# Patient Record
Sex: Female | Born: 1970 | Race: White | Hispanic: No | Marital: Married | Smoking: Never smoker
Health system: Southern US, Community
[De-identification: ages and names within clinical notes are randomized; demographics above are authoritative.]

## PROBLEM LIST (undated history)

## (undated) DIAGNOSIS — I10 Essential (primary) hypertension: Secondary | ICD-10-CM

## (undated) HISTORY — PX: EXTERNAL EAR SURGERY: SHX627

## (undated) HISTORY — PX: EYE SURGERY: SHX253

---

## 2006-10-14 ENCOUNTER — Ambulatory Visit (HOSPITAL_COMMUNITY): Admission: AD | Admit: 2006-10-14 | Discharge: 2006-10-15 | Payer: Self-pay | Admitting: Obstetrics & Gynecology

## 2006-10-14 ENCOUNTER — Encounter (INDEPENDENT_AMBULATORY_CARE_PROVIDER_SITE_OTHER): Payer: Self-pay | Admitting: Obstetrics and Gynecology

## 2008-08-01 ENCOUNTER — Inpatient Hospital Stay (HOSPITAL_COMMUNITY): Admission: AD | Admit: 2008-08-01 | Discharge: 2008-08-03 | Payer: Self-pay | Admitting: Obstetrics and Gynecology

## 2010-05-30 LAB — CBC
HCT: 33.7 % — ABNORMAL LOW (ref 36.0–46.0)
HCT: 38.4 % (ref 36.0–46.0)
Hemoglobin: 12 g/dL (ref 12.0–15.0)
MCHC: 35.6 g/dL (ref 30.0–36.0)
MCV: 95 fL (ref 78.0–100.0)
MCV: 96.8 fL (ref 78.0–100.0)
RBC: 4.05 MIL/uL (ref 3.87–5.11)
RDW: 14.4 % (ref 11.5–15.5)
WBC: 9 10*3/uL (ref 4.0–10.5)

## 2010-06-23 ENCOUNTER — Other Ambulatory Visit: Payer: Self-pay | Admitting: Family Medicine

## 2010-06-23 ENCOUNTER — Other Ambulatory Visit (HOSPITAL_COMMUNITY)
Admission: RE | Admit: 2010-06-23 | Discharge: 2010-06-23 | Disposition: A | Discharge status: Home / Self Care | Payer: Managed Care, Other (non HMO) | Source: Ambulatory Visit | Attending: Family Medicine | Admitting: Family Medicine

## 2010-06-23 DIAGNOSIS — Z1159 Encounter for screening for other viral diseases: Secondary | ICD-10-CM | POA: Insufficient documentation

## 2010-06-23 DIAGNOSIS — Z124 Encounter for screening for malignant neoplasm of cervix: Secondary | ICD-10-CM | POA: Insufficient documentation

## 2010-07-05 NOTE — Op Note (Signed)
NAMEBRITTANIE, Gina Bryant               ACCOUNT NO.:  [id number]   MEDICAL RECORD NO.:  [id number]          PATIENT TYPE:  MAT   LOCATION:  [facility]                          FACILITY:  [facility]   PHYSICIAN:  Lenoard Aden, M.D.DATE OF BIRTH:  Sep 07, 1970   DATE OF PROCEDURE:  10/14/2006  DATE OF DISCHARGE:                               OPERATIVE REPORT   PREOPERATIVE DIAGNOSIS:  Incomplete AB.   POSTOPERATIVE DIAGNOSIS:  Incomplete AB.   PROCEDURE:  Suction D&E.   SURGEON:  Lenoard Aden, M.D.   ANESTHESIA:  MAC paracervical.   BLOOD LOSS:  Blood loss from procedure is minimal.  Total blood loss  from the time of arrival is noted in the operating room and in maternity  admissions of at least 200 mL.  The patient to recovery in good  condition.   DESCRIPTION OF PROCEDURE:  After being apprised of risks of anesthesia,  infection, bleeding, uterine perforation, possible need for repair,  delayed versus immediate complications to include bowel and bladder  injury with possible need for repair, the patient brought to the  operating. She was administered IV sedation without difficulty, prepped  and draped in sterile fashion.  Catheterized until the bladder is empty.  Examination under anesthesia revealed a large amount of vaginal bleeding  and opened cervix and products of conception noted to be at the external  os. Paracervical block placed using a 20 mL of a dilute Xylocaine  solution in the standard fashion.  10 mm suction curette is placed and  products of conception easily extracted from the uterus without  difficulty.  Repeat suction and then curettage in a blunt fashion with  four quadrant method is done without difficulty revealing the cavity to  be empty.  Good hemostasis noted.  All instruments were removed.  The  patient is awakened, transferred to recovery in good condition.      Lenoard Aden, M.D.  Electronically Signed     RJT/MEDQ  D:  10/14/2006  T:   10/14/2006  Job:  [id number]

## 2010-07-05 NOTE — Op Note (Signed)
[name], Gina Bryant               ACCOUNT NO.:  [id number]   MEDICAL RECORD NO.:  [id number]          PATIENT TYPE:  INP   LOCATION:  9115                          FACILITY:  [facility]   PHYSICIAN:  Lenoard Aden, M.D.DATE OF BIRTH:  1970/08/25   DATE OF PROCEDURE:  08/01/2008  DATE OF DISCHARGE:                               OPERATIVE REPORT   DESCRIPTION OF PROCEDURE:  The patient had a spontaneous vaginal  delivery after pushing well of a full-term living female over second-  degree laceration after delivery of the vertex, shoulder dystocia was  encountered.  McRoberts positioning and suprapubic pressure did not  relieve the shoulder dystocia.  Unable to perform Joseph Art or Rubin's screw  maneuver, therefore delivery of the posterior arm was attempted and is  successful.  Total time of the shoulder dystocia is less than 30  seconds.  Apgars on the fetus are 8 and 9, nuchal cord x1 as noted.  It  was reduced on the perineum prior to delivery of the fetal shoulders.  Second-degree laceration was repaired with a 3-0 Vicryl Rapide.  Estimated blood loss was 500 mL.  No complications.  Bulb suctioning  performed.  Placenta delivered spontaneously intact with three-vessel  cord.  No cervical lacerations noted.  Mother and baby recovering in  good condition.      Lenoard Aden, M.D.  Electronically Signed     RJT/MEDQ  D:  08/01/2008  T:  08/01/2008  Job:  [id number]

## 2010-07-05 NOTE — H&P (Signed)
NAMEKASSIE, KENG               ACCOUNT NO.:  [id number]   MEDICAL RECORD NO.:  [id number]          PATIENT TYPE:  INP   LOCATION:  9115                          FACILITY:  [facility]   PHYSICIAN:  Lenoard Aden, M.D.DATE OF BIRTH:  [birth year]   DATE OF ADMISSION:  08/01/2008  DATE OF DISCHARGE:                              HISTORY & PHYSICAL   CHIEF COMPLAINT:  Postdates induction.  She is a 40 year old white  female, G5, P3, history of SAB x1 and SVD x3 who now presents with 41  weeks for induction.  She is a nonsmoker, nondrinker.  She has no known  drug allergies.  She denies domestic or physical violence.  She has a  medical history unremarkable for SAB with emergency D and C and SVD x3.  Prenatal course has been complicated.   MEDICATIONS:  Prenatal vitamins.   PHYSICAL EXAMINATION:  GENERAL:  She is a well-developed, well-nourished  white female in no acute distress.  HEENT:  Normal.  LUNGS:  Clear.  HEART:  Regular rate and rhythm.  ABDOMEN:  Soft, gravid, nontender.  Estimated fetal weight 8 pounds.  Cervix is 3, 50% vertex, -2.  EXTREMITIES:  No cords.  NEUROLOGIC:  Nonfocal.  SKIN:  Intact.  NSG is reactive.   IMPRESSION:  A 41-week intrauterine pregnancy for induction.   PLAN:  Proceed with induction.  Risks, benefits discussed.  IV Pitocin  was started, high-dose protocol, epidural p.r.n., anticipate attempts at  vaginal delivery.      Lenoard Aden, M.D.  Electronically Signed     RJT/MEDQ  D:  08/01/2008  T:  08/01/2008  Job:  [id number]

## 2010-12-02 LAB — CBC
HCT: 22.7 — ABNORMAL LOW
MCHC: 35.6
Platelets: 223
RDW: 12.8
RDW: 13.2

## 2010-12-02 LAB — SAMPLE TO BLOOD BANK

## 2012-10-23 ENCOUNTER — Other Ambulatory Visit: Payer: Self-pay | Admitting: Family Medicine

## 2012-10-23 DIAGNOSIS — R221 Localized swelling, mass and lump, neck: Secondary | ICD-10-CM

## 2012-10-29 ENCOUNTER — Ambulatory Visit
Admission: RE | Admit: 2012-10-29 | Discharge: 2012-10-29 | Disposition: A | Discharge status: Home / Self Care | Payer: Managed Care, Other (non HMO) | Source: Ambulatory Visit | Attending: Family Medicine | Admitting: Family Medicine

## 2012-10-29 DIAGNOSIS — R221 Localized swelling, mass and lump, neck: Secondary | ICD-10-CM

## 2012-10-29 MED ORDER — IOHEXOL 300 MG/ML  SOLN
75.0000 mL | Freq: Once | INTRAMUSCULAR | Status: AC | PRN
  Administered 2012-10-29: 75 mL via INTRAVENOUS

## 2014-12-11 ENCOUNTER — Other Ambulatory Visit: Payer: Self-pay | Admitting: Family Medicine

## 2014-12-11 DIAGNOSIS — R140 Abdominal distension (gaseous): Secondary | ICD-10-CM

## 2014-12-22 ENCOUNTER — Ambulatory Visit
Admission: RE | Admit: 2014-12-22 | Discharge: 2014-12-22 | Disposition: A | Discharge status: Home / Self Care | Payer: Managed Care, Other (non HMO) | Source: Ambulatory Visit | Attending: Family Medicine | Admitting: Family Medicine

## 2014-12-22 DIAGNOSIS — R140 Abdominal distension (gaseous): Secondary | ICD-10-CM

## 2014-12-22 MED ORDER — IOPAMIDOL (ISOVUE-300) INJECTION 61%: 100.0000 mL | Freq: Once | INTRAVENOUS | Status: DC | PRN

## 2015-01-07 ENCOUNTER — Encounter (HOSPITAL_COMMUNITY): Payer: Self-pay | Admitting: Neurology

## 2015-01-07 ENCOUNTER — Emergency Department (HOSPITAL_COMMUNITY)
Admission: EM | Admit: 2015-01-07 | Discharge: 2015-01-08 | Disposition: A | Discharge status: Home / Self Care | Payer: Managed Care, Other (non HMO) | Attending: Emergency Medicine | Admitting: Emergency Medicine

## 2015-01-07 ENCOUNTER — Emergency Department (HOSPITAL_COMMUNITY): Payer: Managed Care, Other (non HMO)

## 2015-01-07 DIAGNOSIS — Z79899 Other long term (current) drug therapy: Secondary | ICD-10-CM | POA: Diagnosis not present

## 2015-01-07 DIAGNOSIS — Z3202 Encounter for pregnancy test, result negative: Secondary | ICD-10-CM | POA: Insufficient documentation

## 2015-01-07 DIAGNOSIS — R0789 Other chest pain: Secondary | ICD-10-CM | POA: Insufficient documentation

## 2015-01-07 DIAGNOSIS — R000 Tachycardia, unspecified: Secondary | ICD-10-CM | POA: Insufficient documentation

## 2015-01-07 DIAGNOSIS — R079 Chest pain, unspecified: Secondary | ICD-10-CM | POA: Diagnosis present

## 2015-01-07 DIAGNOSIS — R0602 Shortness of breath: Secondary | ICD-10-CM | POA: Insufficient documentation

## 2015-01-07 LAB — BASIC METABOLIC PANEL
Anion gap: 8 (ref 5–15)
BUN: 17 mg/dL (ref 6–20)
CALCIUM: 9.7 mg/dL (ref 8.9–10.3)
CO2: 25 mmol/L (ref 22–32)
Chloride: 105 mmol/L (ref 101–111)
Creatinine, Ser: 0.65 mg/dL (ref 0.44–1.00)
GFR calc Af Amer: >60 mL/min (ref >60)
GLUCOSE: 99 mg/dL (ref 65–99)
Potassium: 3.9 mmol/L (ref 3.5–5.1)
Sodium: 138 mmol/L (ref 135–145)

## 2015-01-07 LAB — CBC
HEMATOCRIT: 41.3 % (ref 36.0–46.0)
Hemoglobin: 14.2 g/dL (ref 12.0–15.0)
MCH: 30.8 pg (ref 26.0–34.0)
MCHC: 34.4 g/dL (ref 30.0–36.0)
MCV: 89.6 fL (ref 78.0–100.0)
Platelets: 259 10*3/uL (ref 150–400)
RBC: 4.61 MIL/uL (ref 3.87–5.11)
RDW: 12.8 % (ref 11.5–15.5)
WBC: 7.9 10*3/uL (ref 4.0–10.5)

## 2015-01-07 LAB — URINALYSIS, ROUTINE W REFLEX MICROSCOPIC
BILIRUBIN URINE: NEGATIVE
Glucose, UA: NEGATIVE mg/dL
Ketones, ur: NEGATIVE mg/dL
Leukocytes, UA: NEGATIVE
NITRITE: NEGATIVE
PH: 6 (ref 5.0–8.0)
Protein, ur: NEGATIVE mg/dL
SPECIFIC GRAVITY, URINE: 1.024 (ref 1.005–1.030)

## 2015-01-07 LAB — URINE MICROSCOPIC-ADD ON

## 2015-01-07 LAB — I-STAT BETA HCG BLOOD, ED (MC, WL, AP ONLY): I-stat hCG, quantitative: <5 m[IU]/mL (ref <5)

## 2015-01-07 LAB — I-STAT TROPONIN, ED: TROPONIN I, POC: 0 ng/mL (ref 0.00–0.08)

## 2015-01-07 LAB — PREGNANCY, URINE: Preg Test, Ur: NEGATIVE

## 2015-01-07 MED ORDER — IOHEXOL 350 MG/ML SOLN
100.0000 mL | Freq: Once | INTRAVENOUS | Status: AC | PRN
  Administered 2015-01-07: 100 mL via INTRAVENOUS

## 2015-01-07 MED ORDER — MORPHINE SULFATE (PF) 4 MG/ML IV SOLN
4.0000 mg | Freq: Once | INTRAVENOUS | Status: AC
  Administered 2015-01-07: 4 mg via INTRAVENOUS
  Filled 2015-01-07: qty 1

## 2015-01-07 NOTE — ED Provider Notes (Signed)
CSN: [id number]     Arrival date & time 01/07/15  1706 History   First MD Initiated Contact with Patient 01/07/15 2004     Chief Complaint  Patient presents with  . Chest Pain     (Consider location/radiation/quality/duration/timing/severity/associated sxs/prior Treatment) The history is provided by the patient and medical records. No language interpreter was used.     Gina Bryant is a 44 y.o. female  with no major medical problems presents to the Emergency Department complaining of gradual, persistent, progressively worsening central chest pain radiating to the left arm onset yesterday evening while watching TV.  Pt describes her pain as dull and intermittently sharp and rates it at a 8/10.  Pt denies personal cardiac hx or family cardiac hx. no history of sun death before the age of 44. Patient denies recent travel, swelling of her legs, history of DVT, estrogen usage, recent surgeries or recent fractures.  She reports that her chest pain immediately worsens with movement and palpation. It is also worse with inspiration. She reports this afternoon that her left arm felt heavy but she has had no radiation of the pain from the left side of her chest to her jaw or arm. She reports that during the time that her left arm felt heavy she also felt somewhat clammy but did not become diaphoretic and had no nausea vomiting or diarrhea. She denies sick contacts, cough, congestion, fever, chills, headache, neck pain, abdominal pain, weakness, dizziness, syncope. Nothing seems to make her pain better.   History reviewed. No pertinent past medical history. Past Surgical History  Procedure Laterality Date  . Eye surgery    . External ear surgery     No family history on file. Social History  Substance Use Topics  . Smoking status: Never Smoker   . Smokeless tobacco: None  . Alcohol Use: Yes   OB History    No data available     Review of Systems  Constitutional: Negative for fever,  diaphoresis, appetite change, fatigue and unexpected weight change.  HENT: Negative for mouth sores.   Eyes: Negative for visual disturbance.  Respiratory: Positive for shortness of breath. Negative for cough, chest tightness and wheezing.   Cardiovascular: Positive for chest pain.  Gastrointestinal: Negative for nausea, vomiting, abdominal pain, diarrhea and constipation.  Endocrine: Negative for polydipsia, polyphagia and polyuria.  Genitourinary: Negative for dysuria, urgency, frequency and hematuria.  Musculoskeletal: Negative for back pain and neck stiffness.  Skin: Negative for rash.  Allergic/Immunologic: Negative for immunocompromised state.  Neurological: Negative for syncope, light-headedness and headaches.  Hematological: Does not bruise/bleed easily.  Psychiatric/Behavioral: Negative for sleep disturbance. The patient is not nervous/anxious.       Allergies  Review of patient's allergies indicates no known allergies.  Home Medications   Prior to Admission medications   Medication Sig Start Date End Date Taking? Authorizing Provider  calcium carbonate (TUMS - DOSED IN MG ELEMENTAL CALCIUM) 500 MG chewable tablet Chew 1 tablet by mouth daily as needed for indigestion or heartburn.   Yes Historical Provider, MD  naphazoline-pheniramine (NAPHCON-A) 0.025-0.3 % ophthalmic solution Place 1 drop into both eyes 4 (four) times daily as needed for irritation.   Yes Historical Provider, MD  diazepam (VALIUM) 5 MG tablet Take 1 tablet (5 mg total) by mouth every 12 (twelve) hours as needed for muscle spasms. 01/08/15   Annel Zunker, PA-C  naproxen (NAPROSYN) 500 MG tablet Take 1 tablet (500 mg total) by mouth 3 (three) times daily as  needed (pain and muscle spasm). 01/08/15   Raeann Offner, PA-C   BP 127/88 mmHg  Pulse 70  Temp(Src) 98.3 F (36.8 C) (Oral)  Resp 15  SpO2 98%  LMP 01/06/2015 Physical Exam  Constitutional: She appears well-developed and  well-nourished. No distress.  Awake, alert, nontoxic appearance  HENT:  Head: Normocephalic and atraumatic.  Mouth/Throat: Oropharynx is clear and moist. No oropharyngeal exudate.  Eyes: Conjunctivae are normal. No scleral icterus.  Neck: Normal range of motion. Neck supple.  Cardiovascular: Regular rhythm, normal heart sounds and intact distal pulses.  Tachycardia present.   No murmur heard. Pulses:      Radial pulses are 2+ on the right side, and 2+ on the left side.       Dorsalis pedis pulses are 2+ on the right side, and 2+ on the left side.  Capillary refill < 3 sec  Pulmonary/Chest: Effort normal and breath sounds normal. No respiratory distress. She has no wheezes.  Equal chest expansion  Abdominal: Soft. Bowel sounds are normal. She exhibits no mass. There is no tenderness. There is no rebound and no guarding.  Musculoskeletal: Normal range of motion. She exhibits no edema.  Neurological: She is alert.  Speech is clear and goal oriented Moves extremities without ataxia  Skin: Skin is warm and dry. She is not diaphoretic.  No rash  Psychiatric: She has a normal mood and affect.  Nursing note and vitals reviewed.   ED Course  Procedures (including critical care time) Labs Review Labs Reviewed  URINALYSIS, ROUTINE W REFLEX MICROSCOPIC (NOT AT [facility]) - Abnormal; Notable for the following:    Hgb urine dipstick MODERATE (*)    All other components within normal limits  URINE MICROSCOPIC-ADD ON - Abnormal; Notable for the following:    Squamous Epithelial / LPF 0-5 (*)    Bacteria, UA RARE (*)    All other components within normal limits  BASIC METABOLIC PANEL  CBC  PREGNANCY, URINE  I-STAT TROPOININ, ED  I-STAT BETA HCG BLOOD, ED ([facility], [facility], [facility] ONLY)    Imaging Review Dg Chest 2 View  01/07/2015  CLINICAL DATA:  Acute onset of generalized chest pain and left arm pain. Initial encounter. EXAM: CHEST  2 VIEW COMPARISON:  Chest radiograph performed 05/19/2009 FINDINGS: The  lungs are well-aerated and clear. There is no evidence of focal opacification, pleural effusion or pneumothorax. The heart is normal in size; the mediastinal contour is within normal limits. No acute osseous abnormalities are seen. IMPRESSION: No acute cardiopulmonary process seen. Electronically Signed   By: Roanna Raider M.D.   On: 01/07/2015 18:10   I have personally reviewed and evaluated these images and lab results as part of my medical decision-making.   EKG Interpretation   Date/Time:  Thursday January 07 2015 17:16:23 EST Ventricular Rate:  92 PR Interval:  158 QRS Duration: 80 QT Interval:  354 QTC Calculation: 437 R Axis:   62 Text Interpretation:  Normal sinus rhythm Normal ECG Confirmed by ALLEN   MD, ANTHONY ([id number]) on 01/07/2015 11:43:20 PM        MDM   Final diagnoses:  Left sided chest pain    IVYANNA SIBERT presents with 24 hours of chest pain. Patient here reporting worsening pain with movement and inspiration, no pain to palpation. Patient also a tachycardia. No history of blood clots or risk factors however concern for possible PE. Negative troponin and other labs reassuring. Chest x-ray without pneumonia or pneumothorax. Will obtain CT scan of  the chest.  18:45PM CT scan without evidence of PE or aortic dissection. There is a small left pleural effusion. Patient's pain continues to be present with movement.  No hypoxia here in the emergency department. No rash to suggest evidence of shingles. Will discharge home with anti-inflammatories and muscle relaxers. She is to follow with her primary care physician.  Chest pain is not likely of cardiac or pulmonary etiology d/t presentation, negative CT, VSS, no tracheal deviation, no JVD or new murmur, RRR, breath sounds equal bilaterally, EKG without acute abnormalities, negative troponin, and negative CXR. Pt has been advised to return to the ED if CP becomes exertional, associated with diaphoresis or nausea, radiates  to left jaw/arm, worsens or becomes concerning in any way.   Case has been discussed with and seen by Dr. Freida Busman who agrees with the above plan to discharge.   BP 127/88 mmHg  Pulse 70  Temp(Src) 98.3 F (36.8 C) (Oral)  Resp 15  SpO2 98%  LMP 01/06/2015    Dierdre Forth, PA-C 01/08/15 1610

## 2015-01-07 NOTE — ED Notes (Signed)
Pt reports yesterday was sitting watching TV developed central cp radiating into left arm, continued through night this morning while at her son's school, pain got worse and left arm felt heavy. Denies cardiac. Denies n/v/d, felt clammy. Pt is a x 4

## 2015-01-08 MED ORDER — OXYCODONE-ACETAMINOPHEN 5-325 MG PO TABS
2.0000 | ORAL_TABLET | Freq: Once | ORAL | Status: AC
  Administered 2015-01-08: 2 via ORAL
  Filled 2015-01-08: qty 2

## 2015-01-08 MED ORDER — DIAZEPAM 5 MG PO TABS
5.0000 mg | ORAL_TABLET | Freq: Two times a day (BID) | ORAL | Status: DC | PRN
Start: 2015-01-08 — End: 2017-10-02

## 2015-01-08 MED ORDER — NAPROXEN 500 MG PO TABS: 500.0000 mg | ORAL_TABLET | Freq: Three times a day (TID) | ORAL | Status: DC | PRN

## 2015-01-08 NOTE — ED Provider Notes (Signed)
Medical screening examination/treatment/procedure(s) were conducted as a shared visit with non-physician practitioner(s) and myself.  I personally evaluated the patient during the encounter.   EKG Interpretation   Date/Time:  Thursday January 07 2015 17:16:23 EST Ventricular Rate:  92 PR Interval:  158 QRS Duration: 80 QT Interval:  354 QTC Calculation: 437 R Axis:   62 Text Interpretation:  Normal sinus rhythm Normal ECG Confirmed by [name]   MD, Roxie Gueye ([id number]) on 01/07/2015 11:43:20 PM     Patient here with persistent left-sided chest pain is worse with movement as well as he can deep breath. Chest CT negative for PE. Suspect musculoskeletal etiology and will prescribe medications for this.  [name], MD 01/08/15 0001

## 2015-01-08 NOTE — Discharge Instructions (Signed)
1. Medications: valium, naprosyn, usual home medications 2. Treatment: rest, drink plenty of fluids,  3. Follow Up: Please followup with your primary doctor in 2 days for discussion of your diagnoses and further evaluation after today's visit; if you do not have a primary care doctor use the resource guide provided to find one; Please return to the ER for worsening pain, pain that radiates to the jaw or other concerns    Chest Wall Pain Chest wall pain is pain in or around the bones and muscles of your chest. Sometimes, an injury causes this pain. Sometimes, the cause may not be known. This pain may take several weeks or longer to get better. HOME CARE INSTRUCTIONS  Pay attention to any changes in your symptoms. Take these actions to help with your pain:   Rest as told by your health care provider.   Avoid activities that cause pain. These include any activities that use your chest muscles or your abdominal and side muscles to lift heavy items.   If directed, apply ice to the painful area:  Put ice in a plastic bag.  Place a towel between your skin and the bag.  Leave the ice on for 20 minutes, 2-3 times per day.  Take over-the-counter and prescription medicines only as told by your health care provider.  Do not use tobacco products, including cigarettes, chewing tobacco, and e-cigarettes. If you need help quitting, ask your health care provider.  Keep all follow-up visits as told by your health care provider. This is important. SEEK MEDICAL CARE IF:  You have a fever.  Your chest pain becomes worse.  You have new symptoms. SEEK IMMEDIATE MEDICAL CARE IF:  You have nausea or vomiting.  You feel sweaty or light-headed.  You have a cough with phlegm (sputum) or you cough up blood.  You develop shortness of breath.   This information is not intended to replace advice given to you by your health care provider. Make sure you discuss any questions you have with your health  care provider.   Document Released: 02/06/2005 Document Revised: 10/28/2014 Document Reviewed: 05/04/2014 Elsevier Interactive Patient Education 2016 [employer]    Emergency Department Resource Guide 1) Find a Doctor and Pay Out of Pocket Although you won't have to find out who is covered by your insurance plan, it is a good idea to ask around and get recommendations. You will then need to call the office and see if the doctor you have chosen will accept you as a new patient and what types of options they offer for patients who are self-pay. Some doctors offer discounts or will set up payment plans for their patients who do not have insurance, but you will need to ask so you aren't surprised when you get to your appointment.  2) Contact Your Local Health Department Not all health departments have doctors that can see patients for sick visits, but many do, so it is worth a call to see if yours does. If you don't know where your local health department is, you can check in your phone book. The CDC also has a tool to help you locate your state's health department, and many state websites also have listings of all of their local health departments.  3) Find a Walk-in Clinic If your illness is not likely to be very severe or complicated, you may want to try a walk in clinic. These are popping up all over the country in pharmacies, drugstores, and shopping centers. They're  usually staffed by nurse practitioners or physician assistants that have been trained to treat common illnesses and complaints. They're usually fairly quick and inexpensive. However, if you have serious medical issues or chronic medical problems, these are probably not your best option. ° °No Primary Care Doctor: °- Call Health Connect at  [phone] - they can help you locate a primary care doctor that  accepts your insurance, provides certain services, etc. °- Physician Referral Service- 1-800-533-3463 ° °Chronic Pain  Problems: °Organization         Address  Phone   Notes  °[facility]  ([phone]) 297-2271 Patients need to be referred by their primary care doctor.  ° °Medication Assistance: °Organization         Address  Phone   Notes  °[place] [facility] [address] °[place], [place] [address] (336) 641-8030 --Must be a resident of [place] °-- Must have NO insurance coverage whatsoever (no Medicaid/ Medicare, etc.) °-- The pt. MUST have a primary care doctor that directs their care regularly and follows them in the community °  °[facility]  (866) 331-1348   °[employer]  (888) 892-1162   ° °Agencies that provide inexpensive medical care: °Organization         Address  Phone   Notes  °[facility]  (336) 832-8035   °[facility]    (336) 832-7272   °[facility] [address] °[place], [place] [address] (336) 832-4777   °[facility] [address], °[place] (336) 271-4999   °[facility]    (336) 373-0678   °[facility]    (336) 272-1050   °[facility] ° [address], [place] Phone:  (336) 832-4444, Fax:  (336) 832-4440 Hours of Operation:  9 am - 6 pm, M-F.  Also accepts Medicaid/Medicare and self-pay.  °[facility] ° [address], [place] Phone: (336) [phone], Fax: (336) [phone]. Hours of Operation:  8:30 am - 5:30 pm, M-F.  Also accepts Medicaid and self-pay.  °[facility] [place] [address], [place] Phone: (336) 878-6027   °[facility] [address], [place], [place] (336)723-1848, Ext. 123 Mondays & Thursdays: 7-9 AM.  First 15 patients are seen on a first come, first serve basis. °  ° °Medicaid-accepting [place] Providers: ° °Organization         Address  Phone   Notes  °[facility] [address], [place] (336) [phone] Also  accepts self-pay patients.  °[facility] [address], [place] ° (336) 856-9996   °[facility] [address], [place] (336) 288-8857   °[facility] [address] [place] [address], [place] (336) 299-7000   °Veita Bland [address], [place]  ° (336) 373-1557 Only accepts [place] Access Medicaid patients after they have their name applied to their card.  ° °Self-Pay (no insurance) in [place]: ° °Organization         Address  Phone   Notes  °Sickle Cell Patients, [facility] [address], [place] (336) 832-1970   °[facility] [address],  (336) 832-4400   °[facility] [place] ° [address], [place] (336) 992-4800   °[facility]/Dr. Osei-Bonsu ° [address] [place] [address], [place] or [address], [place] (336) [phone] Phone number for both [place] and [place] locations is the same.  °[facility] [address], [place] (336) [phone]   °[facility] [address] [place] [address], [place] or [address] (336) 852-7530 °(336) 878-2260   °[facility] [address], [place] (336) 350-1642, phone; (336) 294-5005, fax Sees patients 1st and 3rd Saturday of every month.  Must not qualify for public or private insurance (i.e. Medicaid, Medicare, [place] Health Choice, Veterans' Benefits) • Household income should be no more than 200% of the poverty level •The clinic cannot treat you if you are pregnant or think you are pregnant • Sexually transmitted diseases are not treated at the clinic.  ° ° °Dental Care: °Organization         Address  Phone  Notes  °[place] [facility] [address], [place] (336) 641-6152 Accepts children up to age 21 who are enrolled in Medicaid or [place] Health Choice; pregnant  women with a Medicaid card; and children who have applied for Medicaid or [place] Health Choice, but were declined, whose parents can pay a reduced fee at time of service.  °[place] [facility] [place]  [address], [place] (336) 641-7733 Accepts children up to age 21 who are enrolled in Medicaid or [place] Health Choice; pregnant women with a Medicaid card; and children who have applied for Medicaid or [place] Health Choice, but were declined, whose parents can pay a reduced fee at time of service.  °[facility] ° [address], [place] (336) 641-4533 Patients are seen by appointment only. Walk-ins are not accepted. [facility] will see patients 18 years of age and older. °Monday - Tuesday (8am-5pm) °Most Wednesdays (8:30-5pm) °$30 per visit, cash only  °[facility] ° [address], [place] (336) [phone] Patients are seen by appointment only. Walk-ins are not accepted. [facility] will see patients 18 years of age and older. °One Wednesday Evening (Monthly: Volunteer Based).  $30 per visit, cash only  °[facility]  (919) [phone] for adults; Children under age 4, call Graduate Pediatric Dentistry at ([phone]) [phone]. Children aged 4-14, please call ([phone]) [phone] to request a pediatric application. ° Dental services are provided in all areas of dental care including fillings, crowns and bridges, complete and partial dentures, implants, gum treatment, root canals, and extractions. Preventive care is also provided. Treatment is provided to both adults and children. °Patients are selected via a lottery and there is often a waiting list. °  °[facility] [address], °[place] ° (336) 763-8833 [email or web address] °  °[facility] [address], [place],  (336)723-1848, Ext. 123 Second and Fourth Thursday of each month, opens at 6:30 AM; Clinic ends at 9 AM.  Patients are  seen on a first-come first-served basis, and a limited number are seen during each clinic.  ° °[facility] ° [address], [place],  (336) [phone]   Eligibility Requirements °You must have lived in [place], [place], or [place] counties for at least the last three months. °  You cannot be eligible for state or federal sponsored healthcare insurance, including Veterans Administration, Medicaid, or Medicare. °  You generally cannot be eligible for healthcare insurance through   your employer.    How to apply: Eligibility screenings are held every Tuesday and Wednesday afternoon from 1:00 pm until 4:00 pm. You do not need an appointment for the interview!  [facility] [address] [place], [place]   [place]  [place] Department  [place]  (727)[phone]    Behavioral Health Resources in the Community: Intensive Outpatient Programs Organization         Address  Phone  Notes  [place] [place]. [address] [place], [place] [phone]   [facility] [address] [place], [place]   [facility] [address], [place], [place]   [place] [address],  [place], [place] or (204)117-4192   Substance Abuse Resources Organization         Address  Phone  Notes  [facility]  862-182-1385   [place]  (563) 137-7909   [facility] [place]   Chinita Pester  (845) 431-1584   Residential & Outpatient Substance Abuse Program  959-281-1022   Psychological Services Organization         Address  Phone  Notes  [facility] [place]  [place]  [phone]   [place] [address], [place] or [phone]    Mobile Crisis  Teams Organization         Address  Phone  Notes  [facility], Mobile Crisis Care Unit  [phone]   [facility]  [address] [place], [place]   [place] [address], [place] [phone]    Self-Help/Support Groups Organization         Address  Phone             Notes  [place]. of [place] - variety of support groups  [place] Call for more information  Narcotics Anonymous (NA), [facility] [address], [employer] [place]  2 meetings at this location   Special educational needs teacher         Address  Phone  Notes  [facility] [place],    [place]  1-4017591152   [facility]  [address], [place] [phone], [place], [place]   [place], [place] [phone] Admissions: 8am-3pm M-F  Incentives Substance [place] [address],    [place], [place] [phone]   [facility] [address] [place], [place], [place]   [facility] [address],  [place], [place]   [facility] - Intensive Outpatient [address], [place], [place]   [facility] ([place] [address] [place]  [place], [place] [phone] or [phone]   [facility] ([facility]) [address] [place], [place] Accepts Medicaid  [facility] [place] [address],  [place] 1-219-097-3765 Substance Abuse/Addiction Treatment   [facility] Organization         Address  Phone  Notes  [facility]  [phone]   Domenic Schwab, PhD [address] [place], [place]   [phone] or [phone]   [place], [place] [phone]  [facility] [address], [place], [place] (336) [phone]  Insurance/Medicaid/sponsorship through [facility]  °[facility] [address]                                    [place], [place] (336) [phone] Therapy/tele-psych/case  °[facility] [address]  ° [place], [place] (336) [phone]    °Dr. Arfeen  (336) [phone]   °[facility] [place]  [employer] [place] [facility] 1) [address], [place] °2) [address], [place] °3)  [address], [place] (336) 349-3220 °(336) [phone] ° °(336) [phone]   °[place] Child Abuse Hotline (336) 342-1394 or (336) 342-3537 (After Hours)    ° ° ° ° °

## 2015-12-02 ENCOUNTER — Other Ambulatory Visit (HOSPITAL_COMMUNITY)
Admission: RE | Admit: 2015-12-02 | Discharge: 2015-12-02 | Disposition: A | Discharge status: Home / Self Care | Payer: Managed Care, Other (non HMO) | Source: Ambulatory Visit | Attending: Family Medicine | Admitting: Family Medicine

## 2015-12-02 ENCOUNTER — Other Ambulatory Visit: Payer: Self-pay | Admitting: Family Medicine

## 2015-12-02 DIAGNOSIS — Z124 Encounter for screening for malignant neoplasm of cervix: Secondary | ICD-10-CM | POA: Insufficient documentation

## 2015-12-02 DIAGNOSIS — Z1151 Encounter for screening for human papillomavirus (HPV): Secondary | ICD-10-CM | POA: Insufficient documentation

## 2015-12-03 LAB — CYTOLOGY - PAP

## 2017-05-24 ENCOUNTER — Other Ambulatory Visit: Payer: Self-pay | Admitting: Family Medicine

## 2017-05-24 DIAGNOSIS — Z139 Encounter for screening, unspecified: Secondary | ICD-10-CM

## 2017-06-15 ENCOUNTER — Ambulatory Visit: Payer: Managed Care, Other (non HMO)

## 2017-06-25 ENCOUNTER — Ambulatory Visit
Admission: RE | Admit: 2017-06-25 | Discharge: 2017-06-25 | Disposition: A | Discharge status: Home / Self Care | Payer: Commercial Managed Care - PPO | Source: Ambulatory Visit | Attending: Family Medicine | Admitting: Family Medicine

## 2017-06-25 DIAGNOSIS — Z139 Encounter for screening, unspecified: Secondary | ICD-10-CM

## 2017-09-26 ENCOUNTER — Other Ambulatory Visit: Payer: Self-pay | Admitting: Family Medicine

## 2017-09-26 DIAGNOSIS — R0989 Other specified symptoms and signs involving the circulatory and respiratory systems: Secondary | ICD-10-CM

## 2017-10-02 ENCOUNTER — Encounter (HOSPITAL_COMMUNITY): Payer: Self-pay

## 2017-10-02 ENCOUNTER — Emergency Department (HOSPITAL_COMMUNITY): Payer: Commercial Managed Care - PPO

## 2017-10-02 ENCOUNTER — Emergency Department (HOSPITAL_COMMUNITY)
Admission: EM | Admit: 2017-10-02 | Discharge: 2017-10-02 | Disposition: A | Discharge status: Home / Self Care | Payer: Commercial Managed Care - PPO | Attending: Emergency Medicine | Admitting: Emergency Medicine

## 2017-10-02 DIAGNOSIS — Z79899 Other long term (current) drug therapy: Secondary | ICD-10-CM | POA: Diagnosis not present

## 2017-10-02 DIAGNOSIS — R55 Syncope and collapse: Secondary | ICD-10-CM

## 2017-10-02 DIAGNOSIS — I10 Essential (primary) hypertension: Secondary | ICD-10-CM | POA: Diagnosis not present

## 2017-10-02 HISTORY — DX: Essential (primary) hypertension: I10

## 2017-10-02 LAB — CBC
HEMATOCRIT: 41 % (ref 36.0–46.0)
Hemoglobin: 14 g/dL (ref 12.0–15.0)
MCH: 31.1 pg (ref 26.0–34.0)
MCHC: 34.1 g/dL (ref 30.0–36.0)
MCV: 91.1 fL (ref 78.0–100.0)
PLATELETS: 361 10*3/uL (ref 150–400)
RBC: 4.5 MIL/uL (ref 3.87–5.11)
RDW: 12.7 % (ref 11.5–15.5)
WBC: 7.1 10*3/uL (ref 4.0–10.5)

## 2017-10-02 LAB — BASIC METABOLIC PANEL
Anion gap: 10 (ref 5–15)
BUN: 13 mg/dL (ref 6–20)
CHLORIDE: 103 mmol/L (ref 98–111)
CO2: 25 mmol/L (ref 22–32)
CREATININE: 0.62 mg/dL (ref 0.44–1.00)
Calcium: 9.4 mg/dL (ref 8.9–10.3)
GFR calc Af Amer: >60 mL/min (ref >60)
GFR calc non Af Amer: >60 mL/min (ref >60)
GLUCOSE: 125 mg/dL — AB (ref 70–99)
POTASSIUM: 4.1 mmol/L (ref 3.5–5.1)
Sodium: 138 mmol/L (ref 135–145)

## 2017-10-02 LAB — I-STAT BETA HCG BLOOD, ED (NOT ORDERABLE)

## 2017-10-02 LAB — POCT I-STAT TROPONIN I: Troponin i, poc: 0 ng/mL (ref 0.00–0.08)

## 2017-10-02 NOTE — Discharge Instructions (Addendum)
Increase hydration and maintain good sleep.

## 2017-10-02 NOTE — ED Notes (Signed)
Pt denies symptoms at this time. NO CP, SOB or N/V

## 2017-10-02 NOTE — ED Provider Notes (Signed)
[facility]-EMER[facility]PT Provider Note   CSN: [id number] Arrival date & time: 10/02/17  1622     History   Chief Complaint Chief Complaint  Patient presents with  . Tachycardia  . Nausea  . Dizziness    HPI Gina Bryant is a 47 y.o. female.  The history is provided by the patient.  Near Syncope  This is a new problem. The current episode started 1 to 2 hours ago. The problem occurs rarely. The problem has been resolved. Pertinent negatives include no chest pain, no abdominal pain, no headaches and no shortness of breath. Nothing aggravates the symptoms. The symptoms are relieved by rest. She has tried rest for the symptoms. The treatment provided significant relief.    Past Medical History:  Diagnosis Date  . Hypertension     There are no active problems to display for this patient.   Past Surgical History:  Procedure Laterality Date  . EXTERNAL EAR SURGERY    . EYE SURGERY       OB History   None      Home Medications    Prior to Admission medications   Medication Sig Start Date End Date Taking? Authorizing Provider  amLODipine-benazepril (LOTREL) 5-10 MG capsule Take 1 capsule by mouth daily. 10/01/17  Yes [provider]  ibuprofen (ADVIL,MOTRIN) 200 MG tablet Take 400 mg by mouth every 6 (six) hours as needed for mild pain.   Yes [provider]  tretinoin (RETIN-A) 0.025 % cream Apply 1 application topically every other day. Applies a pearl-sized amount to face every other night at bedtime. 08/10/17  Yes [provider]    Family History Family History  Problem Relation Age of Onset  . Hypertension Mother   . Diabetes Father   . Breast cancer Neg Hx     Social History Social History   Tobacco Use  . Smoking status: Never Smoker  . Smokeless tobacco: Never Used  Substance Use Topics  . Alcohol use: Yes    Comment: occasionally  . Drug use: Never     Allergies   Patient has no known  allergies.   Review of Systems Review of Systems  Constitutional: Positive for fatigue. Negative for chills and fever.  HENT: Negative for ear pain, sore throat, trouble swallowing and voice change.   Eyes: Negative for pain and visual disturbance.  Respiratory: Negative for cough and shortness of breath.   Cardiovascular: Positive for palpitations and near-syncope. Negative for chest pain.  Gastrointestinal: Positive for nausea. Negative for abdominal pain and vomiting.  Genitourinary: Negative for dysuria and hematuria.  Musculoskeletal: Negative for arthralgias and back pain.  Skin: Negative for color change, rash and wound.  Neurological: Positive for dizziness. Negative for tremors, seizures, syncope, weakness, light-headedness, numbness and headaches.  Psychiatric/Behavioral: The patient is nervous/anxious.   All other systems reviewed and are negative.    Physical Exam Updated Vital Signs  ED Triage Vitals  Enc Vitals Group     BP 10/02/17 1640 (!) 152/87     Pulse Rate 10/02/17 1640 (!) 102     Resp 10/02/17 1640 17     Temp 10/02/17 1640 98.1 F (36.7 C)     Temp Source 10/02/17 1640 Oral     SpO2 10/02/17 1640 100 %     Weight 10/02/17 1636 162 lb (73.5 kg)     Height 10/02/17 1636 5\' 5"  (1.651 m)     Head Circumference --  Peak Flow --      Pain Score 10/02/17 1635 0     Pain Loc --      Pain Edu? --      Excl. in GC? --     Physical Exam  Constitutional: She is oriented to person, place, and time. She appears well-developed and well-nourished. No distress.  HENT:  Head: Normocephalic and atraumatic.  Mouth/Throat: Oropharynx is clear and moist. No oropharyngeal exudate.  Eyes: Pupils are equal, round, and reactive to light. Conjunctivae and EOM are normal.  Neck: Normal range of motion. Neck supple.  Cardiovascular: Normal rate, regular rhythm, normal heart sounds and intact distal pulses.  No murmur heard. Pulmonary/Chest: Effort normal and breath  sounds normal. No stridor. No respiratory distress. She has no wheezes. She has no rales.  Abdominal: Soft. Bowel sounds are normal. There is no tenderness.  Musculoskeletal: Normal range of motion. She exhibits no edema.  Neurological: She is alert and oriented to person, place, and time. No cranial nerve deficit or sensory deficit. She exhibits normal muscle tone. Coordination normal.  Skin: Skin is warm and dry. Capillary refill takes less than 2 seconds.  Psychiatric: She has a normal mood and affect.  Nursing note and vitals reviewed.    ED Treatments / Results  Labs (all labs ordered are listed, but only abnormal results are displayed) Labs Reviewed  BASIC METABOLIC PANEL - Abnormal; Notable for the following components:      Result Value   Glucose, Bld 125 (*)    All other components within normal limits  CBC  I-STAT TROPONIN, ED  I-STAT BETA HCG BLOOD, ED ([facility], [facility], [facility] ONLY)  POCT I-STAT TROPONIN I  I-STAT BETA HCG BLOOD, ED (NOT ORDERABLE)    EKG EKG Interpretation  Date/Time:  Tuesday October 02 2017 16:38:43 EDT Ventricular Rate:  105 PR Interval:    QRS Duration: 92 QT Interval:  334 QTC Calculation: 442 R Axis:   68 Text Interpretation:  Sinus tachycardia Ventricular premature complex Low voltage, precordial leads Borderline T abnormalities, lateral leads Poor data quality Confirmed by Virgina NorfolkAdam, Pamila Mendibles [phone]([id number]) on 10/02/2017 6:11:36 PM   Radiology Dg Chest 2 View  Result Date: 10/02/2017 CLINICAL DATA:  Tachycardia. EXAM: CHEST - 2 VIEW COMPARISON:  CT chest and chest x-ray dated January 07, 2015. FINDINGS: The heart size and mediastinal contours are within normal limits. Normal pulmonary vascularity. No focal consolidation, pleural effusion, or pneumothorax. No acute osseous abnormality. Mild degenerative changes of the thoracic spine. IMPRESSION: No active cardiopulmonary disease. Electronically Signed   By: Obie DredgeWilliam T Derry M.D.   On: 10/02/2017 17:13     Procedures Procedures (including critical care time)  Medications Ordered in ED Medications - No data to display   Initial Impression / Assessment and Plan / ED Course  I have reviewed the triage vital signs and the nursing notes.  Pertinent labs & imaging results that were available during my care of the patient were reviewed by me and considered in my medical decision making (see chart for details).     Grant Fontanangela S Chauca is a 47 year old female with history of hypertension, anxiety who presents to the ED with near syncope type symptoms.  Patient with normal vitals upon my evaluation.  Normal blood pressure, normal heart rate.  No fever.  Mildly tachycardia upon arrival in triage.  Patient states that she was at medical office today and while sitting and had sudden feeling of nausea, warmth, redness, dizziness but did not pass out.  Symptoms have resolved since arriving to the ED.  Patient denies any chest pain, shortness of breath.  She states that she has been under a lot of stress recently with family.  She denies any melena, hematochezia, nausea, vomiting at this time.  Patient is Wells criteria 0, doubt PE.  Patient with no chest pain but in the first look process had troponin within normal limits and EKG shows no ischemic changes.  Doubt ACS.  Patient with no significant anemia, electrolyte abnormality, kidney injury.  Pregnancy test is negative.  Chest x-ray shows no signs of pneumonia, pneumothorax, pleural effusion.  Patient likely with vasovagal event versus anxiety.  Recommend follow-up with primary care provider and told to return to the ED if symptoms worsen.  Given return precautions.  Told to increase oral hydration and maintain good sleep.  Final Clinical Impressions(s) / ED Diagnoses   Final diagnoses:  Near syncope    ED Discharge Orders    None       Virgina NorfolkCuratolo, Shealyn Sean, DO 10/02/17 1842

## 2017-10-02 NOTE — ED Triage Notes (Signed)
Patient c/o intermittent feeling like her heart is racing, having dizziness, diaphoretic, and nausea x 1 week. Patient states she was sitting still when it happened today and last approx 5-10 minutes

## 2017-10-03 ENCOUNTER — Ambulatory Visit
Admission: RE | Admit: 2017-10-03 | Discharge: 2017-10-03 | Disposition: A | Discharge status: Home / Self Care | Payer: Commercial Managed Care - PPO | Source: Ambulatory Visit | Attending: Family Medicine | Admitting: Family Medicine

## 2017-10-03 DIAGNOSIS — R0989 Other specified symptoms and signs involving the circulatory and respiratory systems: Secondary | ICD-10-CM

## 2017-11-27 ENCOUNTER — Other Ambulatory Visit: Payer: Self-pay | Admitting: Family Medicine

## 2017-11-27 DIAGNOSIS — N83202 Unspecified ovarian cyst, left side: Secondary | ICD-10-CM

## 2017-12-04 ENCOUNTER — Ambulatory Visit
Admission: RE | Admit: 2017-12-04 | Discharge: 2017-12-04 | Disposition: A | Discharge status: Home / Self Care | Payer: Commercial Managed Care - PPO | Source: Ambulatory Visit | Attending: Family Medicine | Admitting: Family Medicine

## 2017-12-04 DIAGNOSIS — N83202 Unspecified ovarian cyst, left side: Secondary | ICD-10-CM

## 2018-03-22 DIAGNOSIS — R351 Nocturia: Secondary | ICD-10-CM | POA: Diagnosis not present

## 2018-03-22 DIAGNOSIS — R350 Frequency of micturition: Secondary | ICD-10-CM | POA: Diagnosis not present

## 2018-03-22 DIAGNOSIS — N393 Stress incontinence (female) (male): Secondary | ICD-10-CM | POA: Diagnosis not present

## 2018-04-29 DIAGNOSIS — J029 Acute pharyngitis, unspecified: Secondary | ICD-10-CM | POA: Diagnosis not present

## 2018-05-22 DIAGNOSIS — I10 Essential (primary) hypertension: Secondary | ICD-10-CM | POA: Diagnosis not present

## 2018-05-22 DIAGNOSIS — K582 Mixed irritable bowel syndrome: Secondary | ICD-10-CM | POA: Diagnosis not present

## 2018-05-22 DIAGNOSIS — Z0000 Encounter for general adult medical examination without abnormal findings: Secondary | ICD-10-CM | POA: Diagnosis not present

## 2018-05-22 DIAGNOSIS — Z6826 Body mass index (BMI) 26.0-26.9, adult: Secondary | ICD-10-CM | POA: Diagnosis not present

## 2018-05-24 DIAGNOSIS — Z6826 Body mass index (BMI) 26.0-26.9, adult: Secondary | ICD-10-CM | POA: Diagnosis not present

## 2018-05-24 DIAGNOSIS — Z1159 Encounter for screening for other viral diseases: Secondary | ICD-10-CM | POA: Diagnosis not present

## 2018-05-24 DIAGNOSIS — Z0000 Encounter for general adult medical examination without abnormal findings: Secondary | ICD-10-CM | POA: Diagnosis not present

## 2018-05-24 DIAGNOSIS — I10 Essential (primary) hypertension: Secondary | ICD-10-CM | POA: Diagnosis not present

## 2018-07-30 ENCOUNTER — Other Ambulatory Visit: Payer: Self-pay | Admitting: Family Medicine

## 2018-07-30 DIAGNOSIS — Z1231 Encounter for screening mammogram for malignant neoplasm of breast: Secondary | ICD-10-CM

## 2018-08-07 ENCOUNTER — Ambulatory Visit: Payer: Commercial Managed Care - PPO

## 2018-09-20 ENCOUNTER — Ambulatory Visit
Admission: RE | Admit: 2018-09-20 | Discharge: 2018-09-20 | Disposition: A | Discharge status: Home / Self Care | Payer: Commercial Managed Care - PPO | Source: Ambulatory Visit | Attending: Family Medicine | Admitting: Family Medicine

## 2018-09-20 ENCOUNTER — Other Ambulatory Visit: Payer: Self-pay

## 2018-09-20 DIAGNOSIS — Z1231 Encounter for screening mammogram for malignant neoplasm of breast: Secondary | ICD-10-CM

## 2019-03-03 ENCOUNTER — Other Ambulatory Visit: Payer: Commercial Managed Care - PPO

## 2019-08-29 ENCOUNTER — Other Ambulatory Visit: Payer: Self-pay | Admitting: Family Medicine

## 2019-08-29 DIAGNOSIS — Z1231 Encounter for screening mammogram for malignant neoplasm of breast: Secondary | ICD-10-CM

## 2019-09-23 ENCOUNTER — Other Ambulatory Visit: Payer: Self-pay

## 2019-09-23 ENCOUNTER — Ambulatory Visit
Admission: RE | Admit: 2019-09-23 | Discharge: 2019-09-23 | Disposition: A | Discharge status: Home / Self Care | Payer: Commercial Managed Care - PPO | Source: Ambulatory Visit | Attending: Family Medicine | Admitting: Family Medicine

## 2019-09-23 DIAGNOSIS — Z1231 Encounter for screening mammogram for malignant neoplasm of breast: Secondary | ICD-10-CM

## 2020-09-06 ENCOUNTER — Other Ambulatory Visit: Payer: Self-pay | Admitting: Family Medicine

## 2020-09-06 DIAGNOSIS — Z1231 Encounter for screening mammogram for malignant neoplasm of breast: Secondary | ICD-10-CM

## 2020-09-23 ENCOUNTER — Ambulatory Visit
Admission: RE | Admit: 2020-09-23 | Discharge: 2020-09-23 | Disposition: A | Discharge status: Home / Self Care | Payer: Commercial Managed Care - PPO | Source: Ambulatory Visit | Attending: Family Medicine | Admitting: Family Medicine

## 2020-09-23 ENCOUNTER — Other Ambulatory Visit: Payer: Self-pay

## 2020-09-23 DIAGNOSIS — Z1231 Encounter for screening mammogram for malignant neoplasm of breast: Secondary | ICD-10-CM

## 2020-09-28 ENCOUNTER — Other Ambulatory Visit: Payer: Self-pay | Admitting: Family Medicine

## 2020-09-28 DIAGNOSIS — R928 Other abnormal and inconclusive findings on diagnostic imaging of breast: Secondary | ICD-10-CM

## 2020-10-07 ENCOUNTER — Ambulatory Visit: Payer: Commercial Managed Care - PPO

## 2020-10-07 ENCOUNTER — Other Ambulatory Visit: Payer: Self-pay

## 2020-10-07 ENCOUNTER — Ambulatory Visit
Admission: RE | Admit: 2020-10-07 | Discharge: 2020-10-07 | Disposition: A | Discharge status: Home / Self Care | Payer: Commercial Managed Care - PPO | Source: Ambulatory Visit | Attending: Family Medicine | Admitting: Family Medicine

## 2020-10-07 DIAGNOSIS — R928 Other abnormal and inconclusive findings on diagnostic imaging of breast: Secondary | ICD-10-CM

## 2021-09-13 ENCOUNTER — Other Ambulatory Visit: Payer: Self-pay | Admitting: Family Medicine

## 2021-09-13 DIAGNOSIS — Z1231 Encounter for screening mammogram for malignant neoplasm of breast: Secondary | ICD-10-CM

## 2021-10-11 ENCOUNTER — Ambulatory Visit
Admission: RE | Admit: 2021-10-11 | Discharge: 2021-10-11 | Disposition: A | Discharge status: Home / Self Care | Payer: Commercial Managed Care - PPO | Source: Ambulatory Visit | Attending: Family Medicine | Admitting: Family Medicine

## 2021-10-11 DIAGNOSIS — Z1231 Encounter for screening mammogram for malignant neoplasm of breast: Secondary | ICD-10-CM

## 2022-09-27 ENCOUNTER — Other Ambulatory Visit: Payer: Self-pay | Admitting: Family Medicine

## 2022-09-27 DIAGNOSIS — Z1231 Encounter for screening mammogram for malignant neoplasm of breast: Secondary | ICD-10-CM

## 2022-10-18 ENCOUNTER — Ambulatory Visit
Admission: RE | Admit: 2022-10-18 | Discharge: 2022-10-18 | Disposition: A | Discharge status: Home / Self Care | Payer: Commercial Managed Care - PPO | Source: Ambulatory Visit | Attending: Family Medicine | Admitting: Family Medicine

## 2022-10-18 DIAGNOSIS — Z1231 Encounter for screening mammogram for malignant neoplasm of breast: Secondary | ICD-10-CM

## 2023-06-22 IMAGING — MG DIGITAL DIAGNOSTIC BILAT W/ TOMO W/ CAD
6 of 12 series · 6 of 36 positions shown · non-contrast
Comparison: Previous exam(s).

CLINICAL DATA: Screening recall for bilateral breast asymmetries.

EXAM:
DIGITAL DIAGNOSTIC BILATERAL MAMMOGRAM WITH TOMOSYNTHESIS AND CAD
TECHNIQUE: Bilateral digital diagnostic mammography and breast tomosynthesis
was performed. The images were evaluated with computer-aided
detection.

[R CC synth-2D]
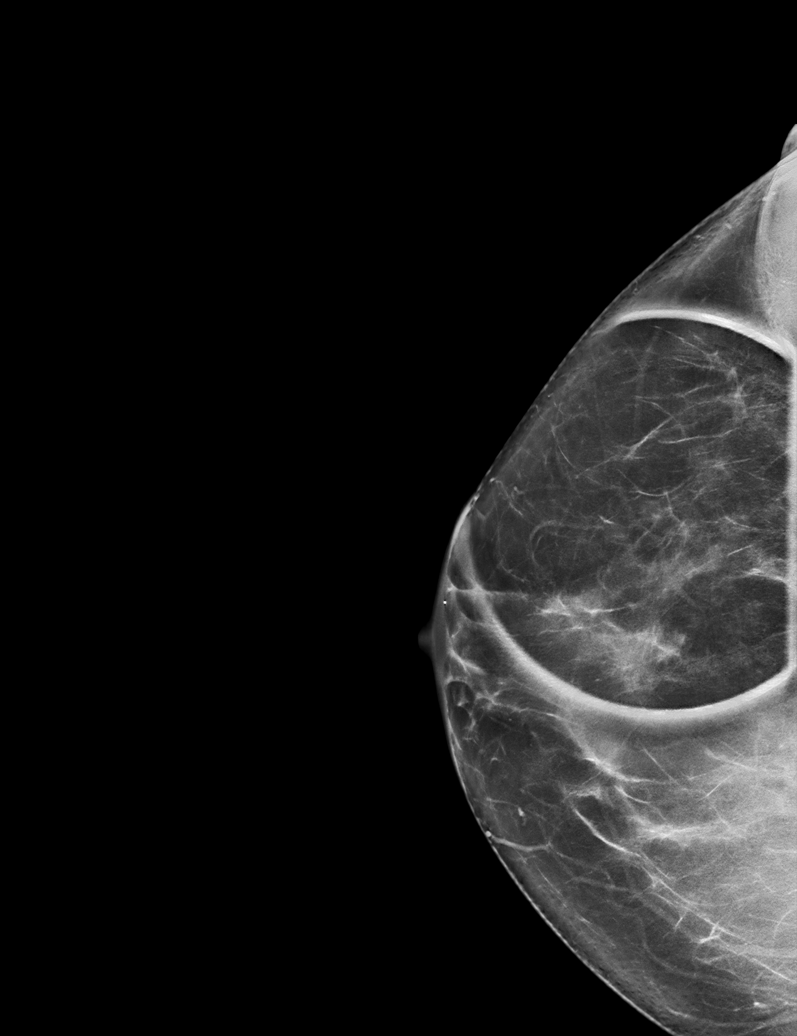

[R ML synth-2D]
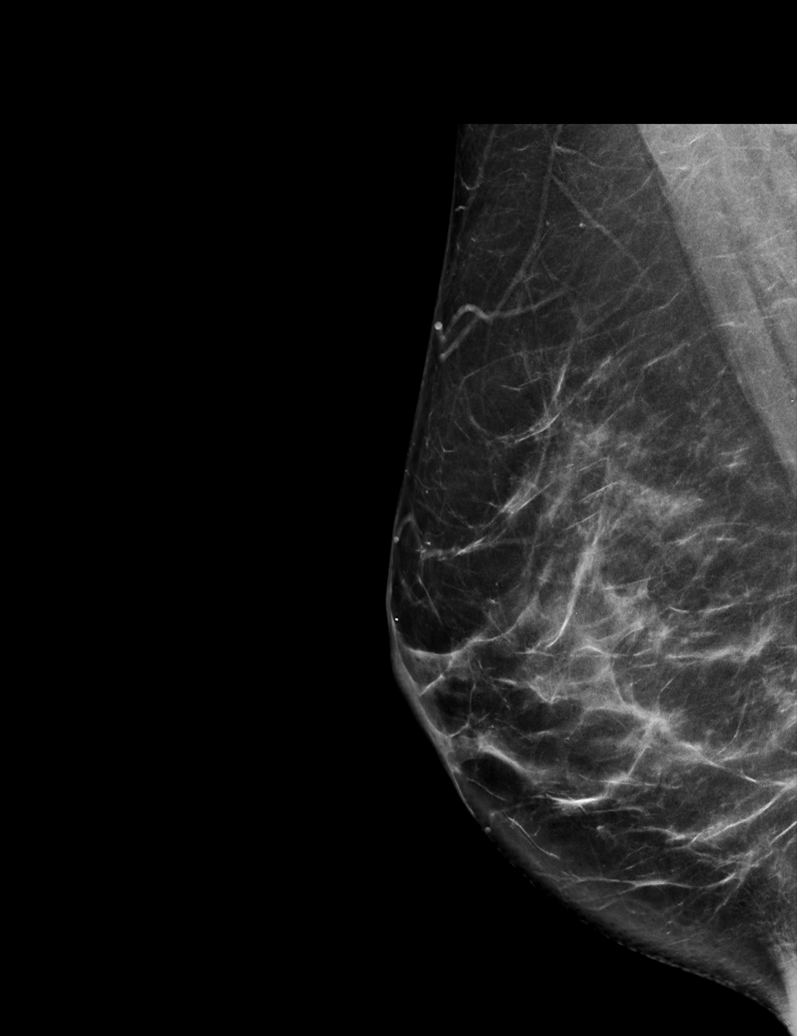

[R XCCL synth-2D]
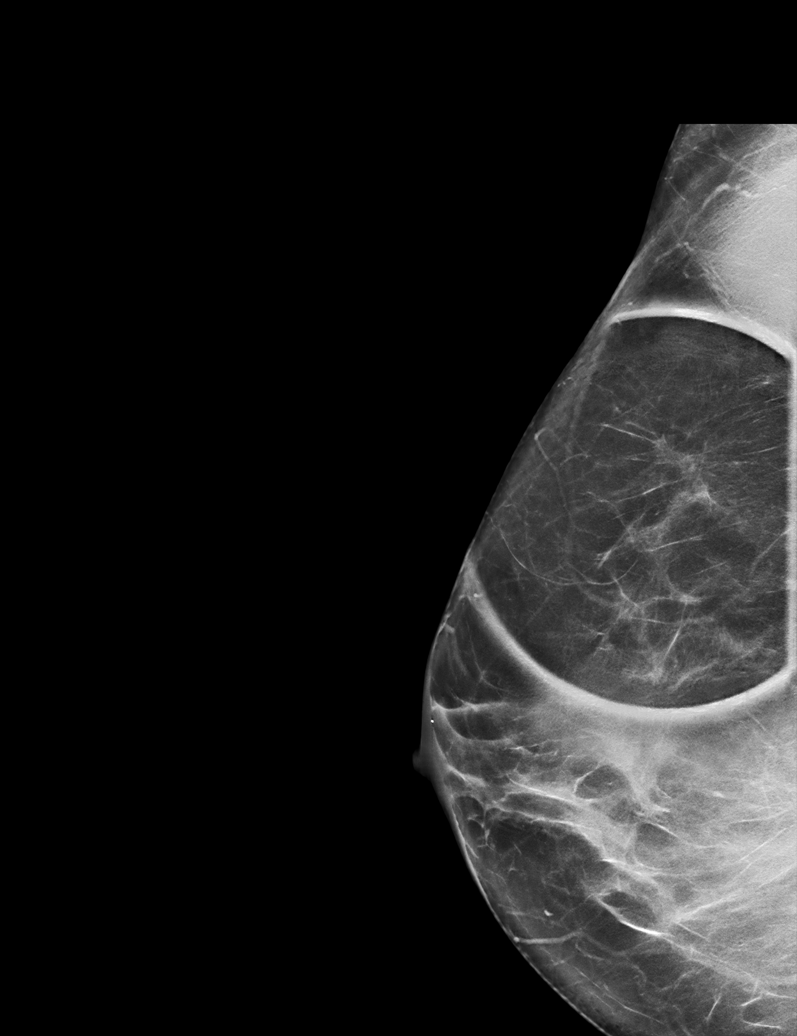

[L ML synth-2D]
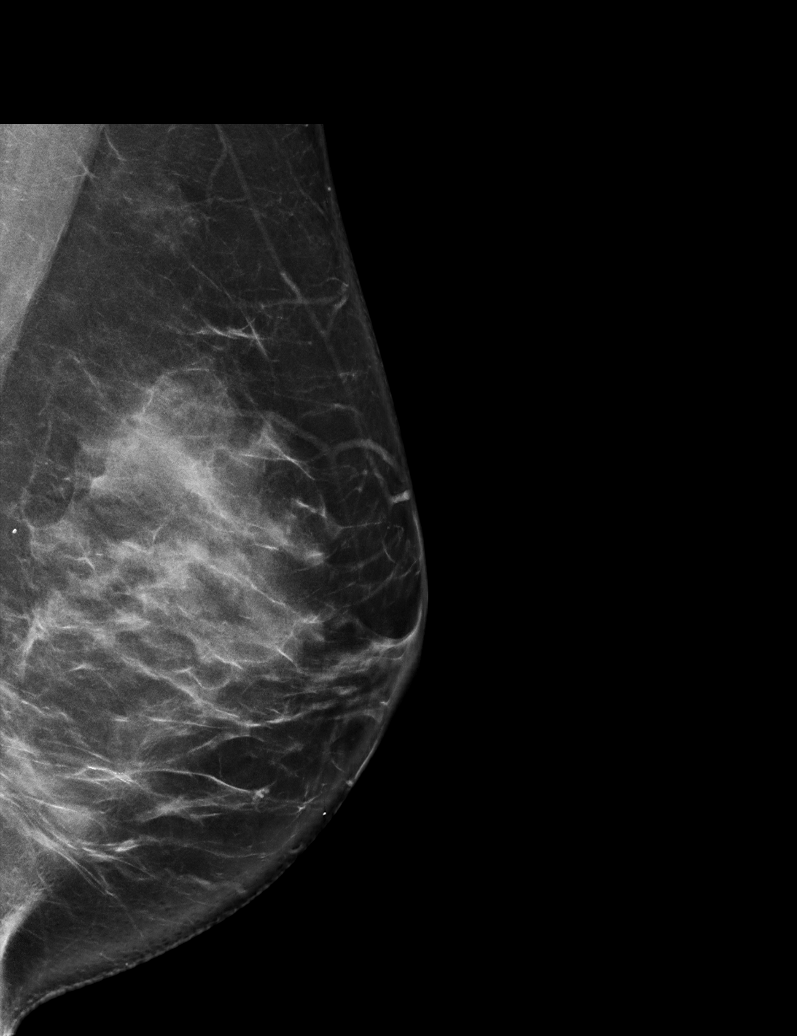

[L MLO synth-2D (1 of 2)]
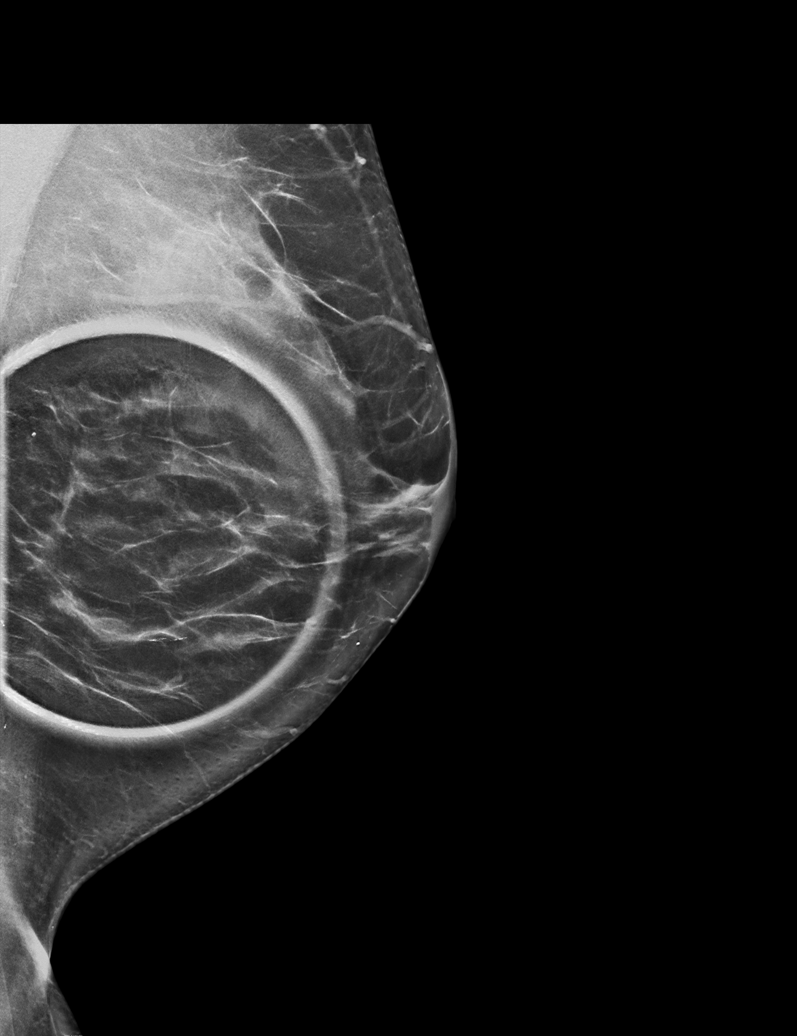

[L MLO synth-2D (2 of 2)]
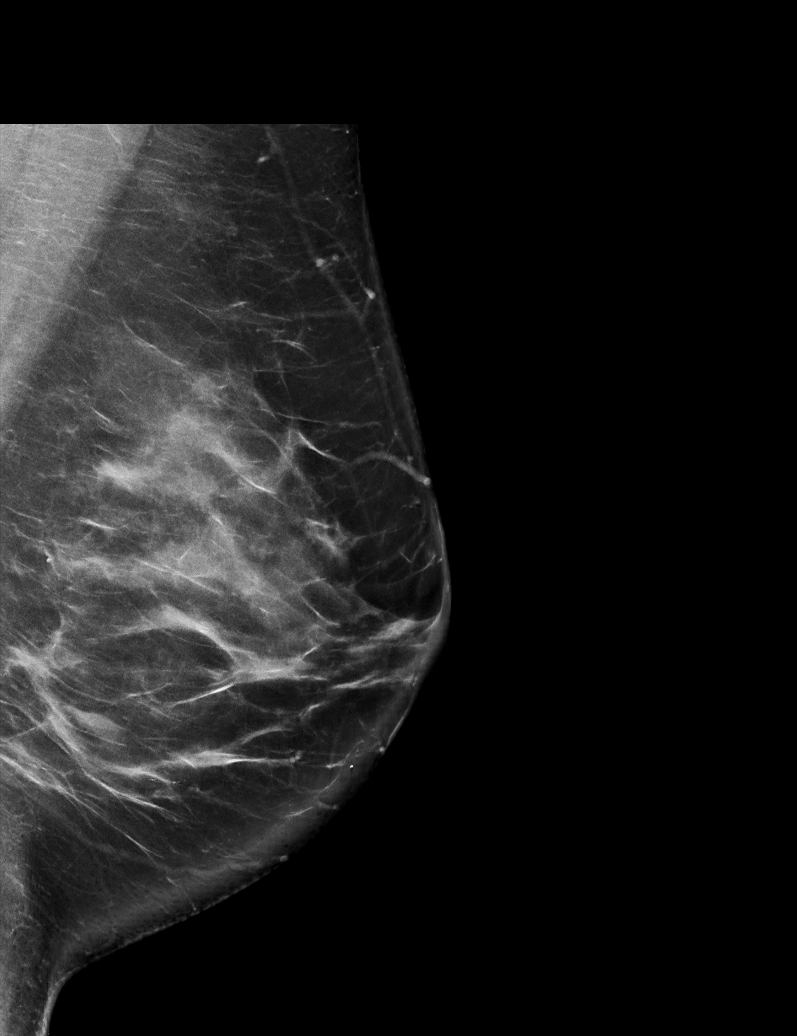

[6 of 36 positions shown; findings below may reference images not displayed]

ACR Breast Density Category c: The breast tissue is heterogeneously
dense, which may obscure small masses.
FINDINGS: The asymmetry in the far lateral posterior right breast resolves
with spot compression tomosynthesis imaging. The asymmetry in the
central far posterior left breast on the MLO view also resolves with
spot compression tomosynthesis imaging. No abnormality persists on a
full paddle true lateral or a repeat MLO image.
IMPRESSION: Resolution of the bilateral breast asymmetries consistent with
overlapping fibroglandular tissue.

RECOMMENDATION:
Screening mammogram in one year.(Code:[id number])

I have discussed the findings and recommendations with the patient.
If applicable, a reminder letter will be sent to the patient
regarding the next appointment.

BI-RADS CATEGORY  1: Negative.

## 2023-10-05 ENCOUNTER — Other Ambulatory Visit: Payer: Self-pay | Admitting: Family Medicine

## 2023-10-05 DIAGNOSIS — Z1231 Encounter for screening mammogram for malignant neoplasm of breast: Secondary | ICD-10-CM

## 2023-10-23 ENCOUNTER — Ambulatory Visit
Admission: RE | Admit: 2023-10-23 | Discharge: 2023-10-23 | Disposition: A | Discharge status: Home / Self Care | Source: Ambulatory Visit | Attending: Family Medicine | Admitting: Family Medicine

## 2023-10-23 DIAGNOSIS — Z1231 Encounter for screening mammogram for malignant neoplasm of breast: Secondary | ICD-10-CM
